# Patient Record
Sex: Male | Born: 1995 | Race: Black or African American | Hispanic: No | Marital: Single | Smoking: Current every day smoker
Health system: Southern US, Community
[De-identification: ages and names within clinical notes are randomized; demographics above are authoritative.]

---

## 2019-11-26 ENCOUNTER — Other Ambulatory Visit: Payer: Self-pay

## 2019-11-26 DIAGNOSIS — Z20822 Contact with and (suspected) exposure to covid-19: Secondary | ICD-10-CM

## 2019-11-27 LAB — NOVEL CORONAVIRUS, NAA: SARS-CoV-2, NAA: NOT DETECTED

## 2020-03-04 ENCOUNTER — Emergency Department: Payer: No Typology Code available for payment source

## 2020-03-04 ENCOUNTER — Emergency Department
Admission: EM | Admit: 2020-03-04 | Discharge: 2020-03-04 | Disposition: A | Discharge status: Home / Self Care | Payer: No Typology Code available for payment source | Attending: Emergency Medicine | Admitting: Emergency Medicine

## 2020-03-04 ENCOUNTER — Other Ambulatory Visit: Payer: Self-pay

## 2020-03-04 ENCOUNTER — Encounter: Payer: Self-pay | Admitting: *Deleted

## 2020-03-04 DIAGNOSIS — W2210XA Striking against or struck by unspecified automobile airbag, initial encounter: Secondary | ICD-10-CM | POA: Diagnosis not present

## 2020-03-04 DIAGNOSIS — S161XXA Strain of muscle, fascia and tendon at neck level, initial encounter: Secondary | ICD-10-CM | POA: Diagnosis not present

## 2020-03-04 DIAGNOSIS — Y9389 Activity, other specified: Secondary | ICD-10-CM | POA: Diagnosis not present

## 2020-03-04 DIAGNOSIS — Y998 Other external cause status: Secondary | ICD-10-CM | POA: Diagnosis not present

## 2020-03-04 DIAGNOSIS — Y92410 Unspecified street and highway as the place of occurrence of the external cause: Secondary | ICD-10-CM | POA: Diagnosis not present

## 2020-03-04 DIAGNOSIS — S39012A Strain of muscle, fascia and tendon of lower back, initial encounter: Secondary | ICD-10-CM

## 2020-03-04 DIAGNOSIS — F17200 Nicotine dependence, unspecified, uncomplicated: Secondary | ICD-10-CM | POA: Insufficient documentation

## 2020-03-04 DIAGNOSIS — S199XXA Unspecified injury of neck, initial encounter: Secondary | ICD-10-CM | POA: Diagnosis present

## 2020-03-04 MED ORDER — METHOCARBAMOL 500 MG PO TABS: 500.0000 mg | ORAL_TABLET | Freq: Four times a day (QID) | ORAL | 0 refills | Status: DC

## 2020-03-04 MED ORDER — MELOXICAM 15 MG PO TABS: 15.0000 mg | ORAL_TABLET | Freq: Every day | ORAL | 0 refills | Status: DC
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
## 2021-08-24 ENCOUNTER — Other Ambulatory Visit: Payer: Self-pay

## 2021-08-24 ENCOUNTER — Ambulatory Visit
Admission: EM | Admit: 2021-08-24 | Discharge: 2021-08-24 | Disposition: A | Discharge status: Home / Self Care | Payer: Managed Care, Other (non HMO) | Attending: Physician Assistant | Admitting: Physician Assistant

## 2021-08-24 DIAGNOSIS — L600 Ingrowing nail: Secondary | ICD-10-CM

## 2021-08-24 DIAGNOSIS — L03031 Cellulitis of right toe: Secondary | ICD-10-CM

## 2021-08-24 MED ORDER — DOXYCYCLINE HYCLATE 100 MG PO CAPS: 100.0000 mg | ORAL_CAPSULE | Freq: Two times a day (BID) | ORAL | 0 refills | Status: AC

## 2021-08-24 MED ORDER — MUPIROCIN 2 % EX OINT: 1.0000 "application " | TOPICAL_OINTMENT | Freq: Three times a day (TID) | CUTANEOUS | 0 refills | Status: AC

## 2021-08-24 NOTE — Discharge Instructions (Signed)
INGROWN TOENAIL W/O REMOVAL: Soak the  foot in warm, soapy water for 10 -20 min 3 times per day for 1-2 weeks, pushing the lateral nail fold away from the nail plate. Alternatively, a solution of water mixed with 1 to 2 teaspoons of Epsom salts can be used. A topical corticosteroid can be applied after soaking to reduce inflammation--Consider using OTC hydrocortisone if a corticosteroid has not been rx for you today. F/u with PCP in 3 days for re-examination or here sooner for any worsening symptoms

## 2021-08-24 NOTE — ED Provider Notes (Signed)
[facility]    CSN: [id number] Arrival date & time: 08/24/21  0808      History   Chief Complaint Chief Complaint  Patient presents with   Toe Pain    HPI Zachary Wright is a 25 y.o. male presenting for 2-week history of swelling of the right great toe around the toenail.  Patient says over the past week he has noticed some pustular drainage and the skin has become a little red.  He missed a discomfort when this area is touched.  He says he has made sure to keep it clean and applied hydrogen peroxide but has had no improvement.  He is also cut the toenail.  He does not report any fevers, chills, fatigue, joint pain, red streak up the toe or foot.  Does not report any similar symptoms in the past.  No injury reported.  No other complaints.  HPI  History reviewed. No pertinent past medical history.  There are no problems to display for this patient.   History reviewed. No pertinent surgical history.     Home Medications    Prior to Admission medications   Medication Sig Start Date End Date Taking? Authorizing Provider  doxycycline (VIBRAMYCIN) 100 MG capsule Take 1 capsule (100 mg total) by mouth 2 (two) times daily for 7 days. 08/24/21 08/31/21 Yes Shirlee Latch, PA-C  mupirocin ointment (BACTROBAN) 2 % Apply 1 application topically 3 (three) times daily for 7 days. 08/24/21 08/31/21 Yes Shirlee Latch PA-C    Family History History reviewed. No pertinent family history.  Social History Social History   Tobacco Use   Smoking status: Every Day    Types: Cigarettes   Smokeless tobacco: Never  Vaping Use   Vaping Use: Some days  Substance Use Topics   Alcohol use: Not Currently   Drug use: Not Currently     Allergies   Patient has no known allergies.   Review of Systems Review of Systems  Constitutional:  Negative for fatigue and fever.  Musculoskeletal:  Negative for arthralgias, gait problem and joint swelling.  Skin:  Positive for color  change. Negative for wound.  Neurological:  Negative for weakness and numbness.    Physical Exam Triage Vital Signs ED Triage Vitals  Enc Vitals Group     BP 08/24/21 0824 117/68     Pulse Rate 08/24/21 0824 (!) 52     Resp 08/24/21 0824 18     Temp 08/24/21 0824 98.7 F (37.1 C)     Temp Source 08/24/21 0824 Oral     SpO2 08/24/21 0824 98 %     Weight 08/24/21 0823 170 lb (77.1 kg)     Height 08/24/21 0823 5\' 8"  (1.727 m)     Head Circumference --      Peak Flow --      Pain Score 08/24/21 0823 5     Pain Loc --      Pain Edu? --      Excl. in GC? --    No data found.  Updated Vital Signs BP 117/68 (BP Location: Right Arm)   Pulse (!) 52   Temp 98.7 F (37.1 C) (Oral)   Resp 18   Ht 5\' 8"  (1.727 m)   Wt 170 lb (77.1 kg)   SpO2 98%   BMI 25.85 kg/m      Physical Exam Vitals and nursing note reviewed.  Constitutional:      General: He is not in acute distress.  Appearance: Normal appearance. He is well-developed. He is not ill-appearing.  HENT:     Head: Normocephalic and atraumatic.  Eyes:     General: No scleral icterus.    Conjunctiva/sclera: Conjunctivae normal.  Cardiovascular:     Rate and Rhythm: Regular rhythm. Bradycardia present.     Pulses: Normal pulses.  Pulmonary:     Effort: Pulmonary effort is normal. No respiratory distress.  Musculoskeletal:     Cervical back: Neck supple.  Skin:    General: Skin is warm and dry.     Comments: RIGHT GREAT TOE: There is swelling of the cuticle/nailfold with scant pustular/bloody drainage and erythema. Area is TTP. Toenail has been clipped. Full ROM of toe.  Neurological:     General: No focal deficit present.     Mental Status: He is alert. Mental status is at baseline.     Motor: No weakness.     Coordination: Coordination normal.     Gait: Gait normal.  Psychiatric:        Mood and Affect: Mood normal.        Behavior: Behavior normal.        Thought Content: Thought content normal.     UC  Treatments / Results  Labs (all labs ordered are listed, but only abnormal results are displayed) Labs Reviewed - No data to display  EKG   Radiology No results found.  Procedures Procedures (including critical care time)  Medications Ordered in UC Medications - No data to display  Initial Impression / Assessment and Plan / UC Course  I have reviewed the triage vital signs and the nursing notes.  Pertinent labs & imaging results that were available during my care of the patient were reviewed by me and considered in my medical decision making (see chart for details).    25 year old male presenting for infected ingrown toenail over the past 1 to 2 weeks.  Patient has already clipped the toenail and applied hydrogen peroxide and practice good hygiene without improvement in symptoms.  On exam, symptoms are mild to moderate.  We will treat at this time with doxycycline and mupirocin ointment.  Also advised warm Epson salt soaks.  Reviewed how to lift the remaining nail up once the swelling starts to go down.  Advised to follow with PCP if not getting better over the next 7 to 10 days or for any worsening of symptoms.  He also knows he can return to our department if needed.   Final Clinical Impressions(s) / UC Diagnoses   Final diagnoses:  Paronychia, toe, right  Ingrown right big toenail     Discharge Instructions      INGROWN TOENAIL W/O REMOVAL: Soak the  foot in warm, soapy water for 10 -20 min 3 times per day for 1-2 weeks, pushing the lateral nail fold away from the nail plate. Alternatively, a solution of water mixed with 1 to 2 teaspoons of Epsom salts can be used. A topical corticosteroid can be applied after soaking to reduce inflammation--Consider using OTC hydrocortisone if a corticosteroid has not been rx for you today. F/u with PCP in 3 days for re-examination or here sooner for any worsening symptoms     ED Prescriptions     Medication Sig Dispense Auth. Provider    doxycycline (VIBRAMYCIN) 100 MG capsule Take 1 capsule (100 mg total) by mouth 2 (two) times daily for 7 days. 14 capsule Eusebio Friendly B, PA-C   mupirocin ointment (BACTROBAN) 2 % Apply 1 application  topically 3 (three) times daily for 7 days. 22 g Shirlee Latch, PA-C      PDMP not reviewed this encounter.   Shirlee Latch, PA-C 08/24/21 [phone]

## 2021-08-24 NOTE — ED Triage Notes (Signed)
Pt here with C/O right big toe swelling and pus coming out for 2 weeks. Thinks it is an ingrown toenail
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
